# Patient Record
Sex: Male | Born: 2000 | Race: White | Hispanic: No | Marital: Single | Smoking: Never smoker
Health system: Southern US, Community
[De-identification: ages and names within clinical notes are randomized; demographics above are authoritative.]

## PROBLEM LIST (undated history)

## (undated) DIAGNOSIS — M1990 Unspecified osteoarthritis, unspecified site: Secondary | ICD-10-CM

## (undated) HISTORY — PX: TONSILLECTOMY: SUR1361

---

## 2020-07-18 ENCOUNTER — Ambulatory Visit
Admission: RE | Admit: 2020-07-18 | Discharge: 2020-07-18 | Disposition: A | Discharge status: Home / Self Care | Payer: Commercial Managed Care - PPO | Attending: Family Medicine | Admitting: Family Medicine

## 2020-07-18 ENCOUNTER — Ambulatory Visit
Admission: RE | Admit: 2020-07-18 | Discharge: 2020-07-18 | Disposition: A | Discharge status: Home / Self Care | Payer: Commercial Managed Care - PPO | Source: Ambulatory Visit | Attending: Family Medicine | Admitting: Family Medicine

## 2020-07-18 ENCOUNTER — Other Ambulatory Visit: Payer: Self-pay

## 2020-07-18 ENCOUNTER — Other Ambulatory Visit: Payer: Self-pay | Admitting: Family Medicine

## 2020-07-18 DIAGNOSIS — J9311 Primary spontaneous pneumothorax: Secondary | ICD-10-CM

## 2020-07-30 ENCOUNTER — Emergency Department: Payer: Commercial Managed Care - PPO

## 2020-07-30 ENCOUNTER — Other Ambulatory Visit: Payer: Self-pay

## 2020-07-30 ENCOUNTER — Emergency Department
Admission: EM | Admit: 2020-07-30 | Discharge: 2020-07-30 | Disposition: A | Discharge status: Home / Self Care | Payer: Commercial Managed Care - PPO | Attending: Student in an Organized Health Care Education/Training Program | Admitting: Student in an Organized Health Care Education/Training Program

## 2020-07-30 DIAGNOSIS — Z20822 Contact with and (suspected) exposure to covid-19: Secondary | ICD-10-CM | POA: Insufficient documentation

## 2020-07-30 DIAGNOSIS — J189 Pneumonia, unspecified organism: Secondary | ICD-10-CM

## 2020-07-30 DIAGNOSIS — J181 Lobar pneumonia, unspecified organism: Secondary | ICD-10-CM | POA: Diagnosis not present

## 2020-07-30 DIAGNOSIS — R079 Chest pain, unspecified: Secondary | ICD-10-CM | POA: Diagnosis present

## 2020-07-30 HISTORY — DX: Unspecified osteoarthritis, unspecified site: M19.90

## 2020-07-30 LAB — CBC
HCT: 40 % (ref 39.0–52.0)
Hemoglobin: 13.6 g/dL (ref 13.0–17.0)
MCH: 31.3 pg (ref 26.0–34.0)
MCHC: 34 g/dL (ref 30.0–36.0)
MCV: 92 fL (ref 80.0–100.0)
Platelets: 369 10*3/uL (ref 150–400)
RBC: 4.35 MIL/uL (ref 4.22–5.81)
RDW: 11.5 % (ref 11.5–15.5)
WBC: 20.4 10*3/uL — ABNORMAL HIGH (ref 4.0–10.5)
nRBC: 0 % (ref 0.0–0.2)

## 2020-07-30 LAB — BASIC METABOLIC PANEL
Anion gap: 8 (ref 5–15)
BUN: 10 mg/dL (ref 6–20)
CO2: 28 mmol/L (ref 22–32)
Calcium: 8.8 mg/dL — ABNORMAL LOW (ref 8.9–10.3)
Chloride: 99 mmol/L (ref 98–111)
Creatinine, Ser: 0.87 mg/dL (ref 0.61–1.24)
GFR, Estimated: >60 mL/min (ref >60)
Glucose, Bld: 104 mg/dL — ABNORMAL HIGH (ref 70–99)
Potassium: 3.7 mmol/L (ref 3.5–5.1)
Sodium: 135 mmol/L (ref 135–145)

## 2020-07-30 LAB — TROPONIN I (HIGH SENSITIVITY)
Troponin I (High Sensitivity): 3 ng/L (ref <18)
Troponin I (High Sensitivity): 3 ng/L (ref <18)

## 2020-07-30 LAB — RESPIRATORY PANEL BY RT PCR (FLU A&B, COVID)
Influenza A by PCR: NEGATIVE
Influenza B by PCR: NEGATIVE
SARS Coronavirus 2 by RT PCR: NEGATIVE

## 2020-07-30 MED ORDER — LIDOCAINE HCL (PF) 1 % IJ SOLN
2.1000 mL | Freq: Once | INTRAMUSCULAR | Status: AC
  Administered 2020-07-30: 2.1 mL
  Filled 2020-07-30: qty 5

## 2020-07-30 MED ORDER — AZITHROMYCIN 250 MG PO TABS: ORAL_TABLET | ORAL | 0 refills | Status: AC

## 2020-07-30 MED ORDER — PSEUDOEPH-BROMPHEN-DM 30-2-10 MG/5ML PO SYRP: 10.0000 mL | ORAL_SOLUTION | Freq: Four times a day (QID) | ORAL | 0 refills | Status: AC | PRN

## 2020-07-30 MED ORDER — BENZONATATE 100 MG PO CAPS: 100.0000 mg | ORAL_CAPSULE | Freq: Four times a day (QID) | ORAL | 0 refills | Status: AC | PRN

## 2020-07-30 MED ORDER — AZITHROMYCIN 500 MG PO TABS
500.0000 mg | ORAL_TABLET | Freq: Once | ORAL | Status: AC
  Administered 2020-07-30: 500 mg via ORAL
  Filled 2020-07-30: qty 1

## 2020-07-30 MED ORDER — CEFTRIAXONE SODIUM 1 G IJ SOLR
1.0000 g | Freq: Once | INTRAMUSCULAR | Status: AC
  Administered 2020-07-30: 1 g via INTRAMUSCULAR
  Filled 2020-07-30: qty 10

## 2020-07-30 NOTE — ED Provider Notes (Signed)
[facility] Emergency Department Provider Note  ____________________________________________  Time seen: Approximately 9:59 PM  I have reviewed the triage vital signs and the nursing notes.   HISTORY  Chief Complaint Chest Pain    HPI Travis Stephens is a 19 y.o. male who presents the emergency department complaining of right-sided "lung" pain.  Patient states that 2 weeks ago he had some coughing, some chest pain was seen and diagnosed with pneumomediastinum.  This has resolved on imaging.  Patient states that he has had continued cough and now is experiencing right-sided chest pain.  Patient states that his cough is productive.  He has had some fevers but no nasal congestion or sore throat.   I visualized the x-ray from 07/18/2020 that was performed that showed pneumomediastinum.  Follow-up imaging was not appreciated until today's imaging in the emergency department.        Past Medical History:  Diagnosis Date  . Arthritis     There are no problems to display for this patient.   Past Surgical History:  Procedure Laterality Date  . TONSILLECTOMY      Prior to Admission medications   Medication Sig Start Date End Date Taking? Authorizing Provider  azithromycin (ZITHROMAX Z-PAK) 250 MG tablet Take 2 tablets (500 mg) on  Day 1,  followed by 1 tablet (250 mg) once daily on Days 2 through 5. 07/30/20   Tayton Decaire, Delorise Royals, PA-C  benzonatate (TESSALON PERLES) 100 MG capsule Take 1 capsule (100 mg total) by mouth every 6 (six) hours as needed. 07/30/20 07/30/21  Tayelor Osborne, Delorise Royals, PA-C  brompheniramine-pseudoephedrine-DM 30-2-10 MG/5ML syrup Take 10 mLs by mouth 4 (four) times daily as needed. 07/30/20   Ronnetta Currington, Delorise Royals, PA-C    Allergies Keflex [cephalexin]  No family history on file.  Social History Social History   Tobacco Use  . Smoking status: Never Smoker  . Smokeless tobacco: Never Used  Substance Use Topics  . Alcohol  use: Not on file    Comment: on the weekends   . Drug use: Yes    Types: Marijuana     Review of Systems  Constitutional: Positive fever/chills Eyes: No visual changes. No discharge ENT: No upper respiratory complaints. Cardiovascular: Right-sided lung/chest pain. Respiratory: Positive cough. No SOB. Gastrointestinal: No abdominal pain.  No nausea, no vomiting.  No diarrhea.  No constipation. Musculoskeletal: Negative for musculoskeletal pain. Skin: Negative for rash, abrasions, lacerations, ecchymosis. Neurological: Negative for headaches, focal weakness or numbness.  10 System ROS otherwise negative.  ____________________________________________   PHYSICAL EXAM:  VITAL SIGNS: ED Triage Vitals  Enc Vitals Group     BP 07/30/20 1906 (!) 126/91     Pulse Rate 07/30/20 1906 69     Resp 07/30/20 1906 18     Temp 07/30/20 1906 98.5 F (36.9 C)     Temp Source 07/30/20 1906 Oral     SpO2 07/30/20 1906 98 %     Weight 07/30/20 1908 155 lb (70.3 kg)     Height 07/30/20 1908 5\' 11"  (1.803 m)     Head Circumference --      Peak Flow --      Pain Score 07/30/20 1909 5     Pain Loc --      Pain Edu? --      Excl. in GC? --      Constitutional: Alert and oriented. Well appearing and in no acute distress. Eyes: Conjunctivae are normal. PERRL. EOMI. Head: Atraumatic. ENT:  Ears:       Nose: No congestion/rhinnorhea.      Mouth/Throat: Mucous membranes are moist.  Neck: No stridor.    Cardiovascular: Normal rate, regular rhythm. Normal S1 and S2.  Good peripheral circulation. Respiratory: Normal respiratory effort without tachypnea or retractions. Lungs CTAB with no appreciable wheezing, rales, rhonchi.Peri Jefferson air entry to the bases with no decreased or absent breath sounds. Musculoskeletal: Full range of motion to all extremities. No gross deformities appreciated. Neurologic:  Normal speech and language. No gross focal neurologic deficits are appreciated.  Skin:  Skin  is warm, dry and intact. No rash noted. Psychiatric: Mood and affect are normal. Speech and behavior are normal. Patient exhibits appropriate insight and judgement.   ____________________________________________   LABS (all labs ordered are listed, but only abnormal results are displayed)  Labs Reviewed  BASIC METABOLIC PANEL - Abnormal; Notable for the following components:      Result Value   Glucose, Bld 104 (*)    Calcium 8.8 (*)    All other components within normal limits  CBC - Abnormal; Notable for the following components:   WBC 20.4 (*)    All other components within normal limits  RESPIRATORY PANEL BY RT PCR (FLU A&B, COVID)  TROPONIN I (HIGH SENSITIVITY)  TROPONIN I (HIGH SENSITIVITY)   ____________________________________________  EKG   ____________________________________________  RADIOLOGY I personally viewed and evaluated these images as part of my medical decision making, as well as reviewing the written report by the radiologist.  ED Provider Interpretation: Visualization of the patient's chest x-ray reveals right middle lobe opacity consistent with pneumonia.  Previous pneumo mediastinum is resolved.  DG Chest 2 View  Result Date: 07/30/2020 CLINICAL DATA:  Chest pain.  Right-sided pain.  Shortness of breath. EXAM: CHEST - 2 VIEW COMPARISON:  Radiograph 07/18/2020 FINDINGS: Right middle lobe opacity is new from prior exam. The pneumomediastinum on prior radiograph pass resolved in the interim. Lungs are hyperinflated. Lucency involving the anterior subcutaneous tissues of the lower chest on the lateral view may be sequela of prior subcutaneous emphysema or be overlying artifact. The heart is normal in size. No pleural fluid. No pneumothorax. No acute osseous abnormalities are seen. IMPRESSION: 1. Right middle lobe opacity, new from radiographs 12 days ago. This may represent pneumonia in the appropriate clinical setting. 2. Previous pneumomediastinum has  resolved. Lucency projecting over the anterior chest wall on the lateral view may be related to prior subcutaneous emphysema or artifact. 3. Hyperinflation likely related to asthma. Electronically Signed   By: Narda Rutherford M.D.   On: 07/30/2020 19:39    ____________________________________________    PROCEDURES  Procedure(s) performed:    Procedures    Medications  cefTRIAXone (ROCEPHIN) injection 1 g (has no administration in time range)  lidocaine (PF) (XYLOCAINE) 1 % injection 2.1 mL (has no administration in time range)  azithromycin (ZITHROMAX) tablet 500 mg (has no administration in time range)     ____________________________________________   INITIAL IMPRESSION / ASSESSMENT AND PLAN / ED COURSE  Pertinent labs & imaging results that were available during my care of the patient were reviewed by me and considered in my medical decision making (see chart for details).  Review of the [place] CSRS was performed in accordance of the [facility] prior to dispensing any controlled drugs.           Patient's diagnosis is consistent with right middle lobe pneumonia.  Patient presented to emergency department complaining of cough, right-sided "lung pain".  No cardiac history.  Patient had been seen earlier this month on the 13th for right-sided chest pain and had findings consistent with right-sided pneumomediastinum.  I visualized the outpatient x-ray today.  Pneumomediastinum has resolved.  Findings on today's imaging are consistent with right middle lobe pneumonia.  Community-acquired pneumonia with elevation of white blood cell count.  Patient had also been on steroids recently likely contributing to his white blood cell count.  Patient arrives afebrile with no recent antipyretics.  No evidence of sepsis.  Patient will receive Rocephin, azithromycin for right middle lobe community-acquired pneumonia.  Patient is able to follow-up with student health at [facility] and given his recent  pneumomediastinum, now right middle lobe pneumonia he should follow-up in 1 month for repeat x-ray to ensure clearance of pneumonia..  Patient is given ED precautions to return to the ED for any worsening or new symptoms.     ____________________________________________  FINAL CLINICAL IMPRESSION(S) / ED DIAGNOSES  Final diagnoses:  Community acquired pneumonia of right middle lobe of lung      NEW MEDICATIONS STARTED DURING THIS VISIT:  ED Discharge Orders         Ordered    azithromycin (ZITHROMAX Z-PAK) 250 MG tablet        07/30/20 2226    brompheniramine-pseudoephedrine-DM 30-2-10 MG/5ML syrup  4 times daily PRN        07/30/20 2226    benzonatate (TESSALON PERLES) 100 MG capsule  Every 6 hours PRN        07/30/20 2226              This chart was dictated using voice recognition software/Dragon. Despite best efforts to proofread, errors can occur which can change the meaning. Any change was purely unintentional.    Racheal Patches, PA-C 07/30/20 2227    Willy Eddy, MD 07/30/20 2302

## 2020-07-30 NOTE — ED Notes (Signed)
See triage note. Pt ambulatory to room. Pt c/o midsternal CP. Pt seen by PCP recently due to SOB and increase pain with inspiration. Pt in NAD.

## 2020-07-30 NOTE — ED Triage Notes (Signed)
PT to ED from home c/o stabbing pain to "lung". PT  Point to right/middle chest. States pain worse with breathing. PT has been sick recently, neg covid test 1 wk ago. PT shob. PT has asthma, denies cigarettes, recent surgery or road trips.

## 2022-05-24 IMAGING — CR DG CHEST 2V
1 series · 2 of 2 positions shown · non-contrast
Comparison: None.

CLINICAL DATA: Shortness of breath. Concern for spontaneous
pneumothorax.

EXAM:
CHEST - 2 VIEW

[Series 1: dg chest 2 view · 0.14mm/px · 2 of 2 slices shown]
[im 1/2]
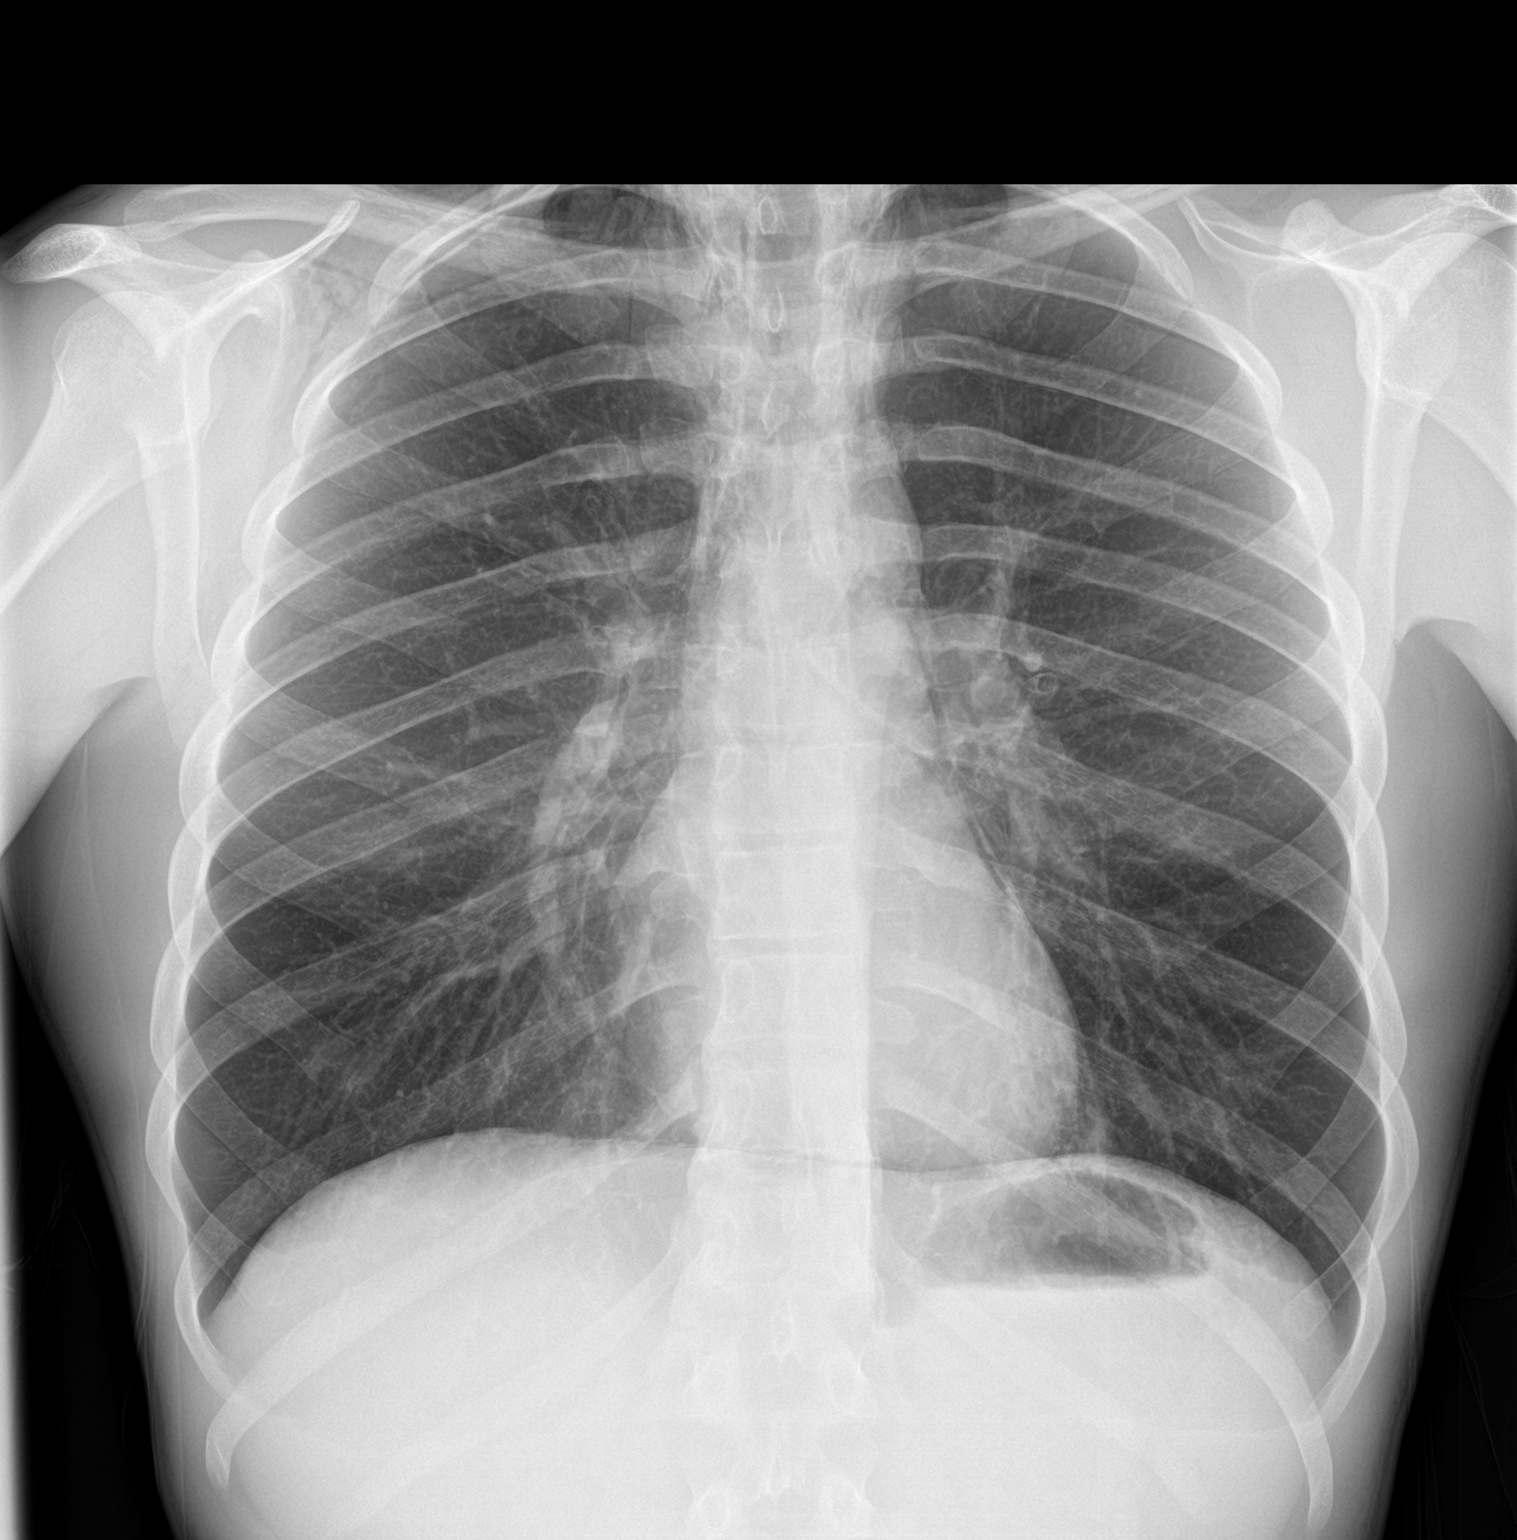
[im 2/2]
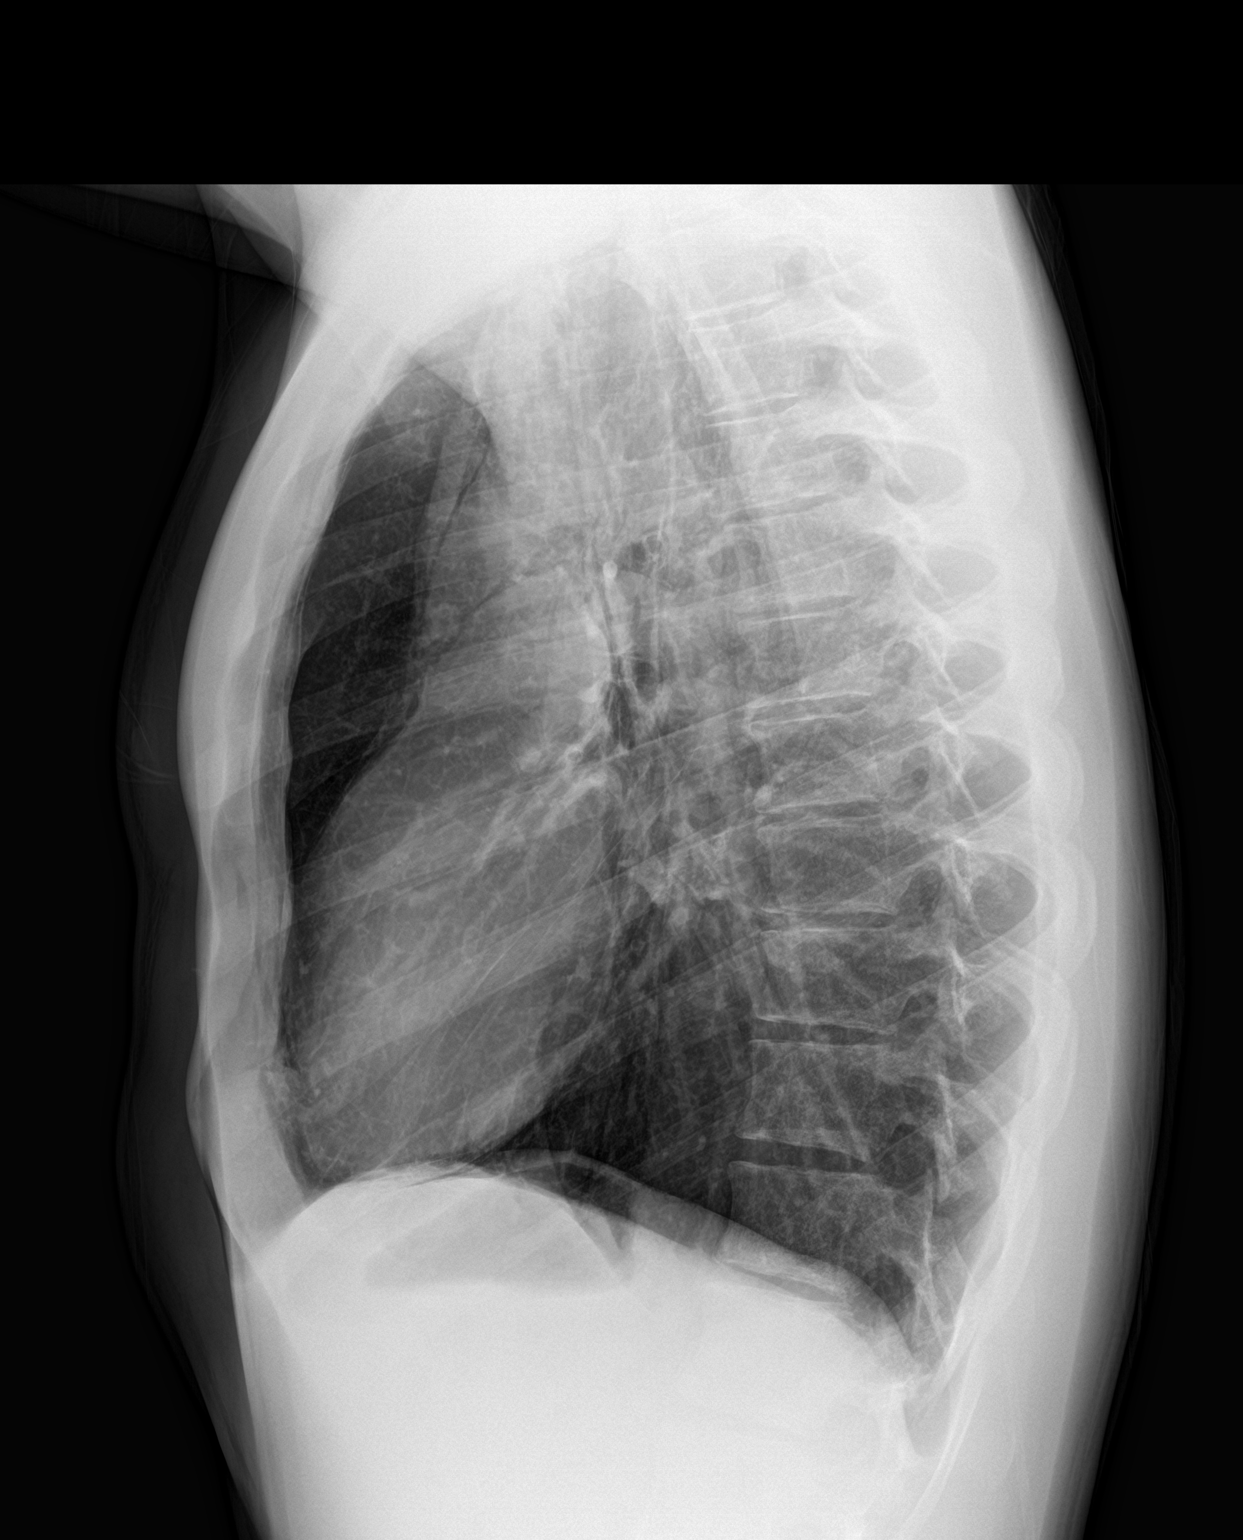

[2 of 2 positions shown; findings below may reference images not displayed]

FINDINGS: Two views study shows mild hyperexpansion. No focal consolidation or
pulmonary edema. No pleural effusion. No pneumothorax. There is
extensive pneumomediastinum with gas visible in the soft tissues of
the supraclavicular regions and right shoulder.
IMPRESSION: Extensive pneumomediastinum with soft tissue gas visible in the
supraclavicular regions and right shoulder. No pneumothorax.
